# Patient Record
Sex: Female | Born: 1955 | Race: Black or African American | Hispanic: No | Marital: Single | Smoking: Never smoker
Health system: Southern US, Community
[De-identification: ages and names within clinical notes are randomized; demographics above are authoritative.]

## PROBLEM LIST (undated history)

## (undated) HISTORY — PX: CHOLECYSTECTOMY: SHX55

---

## 2000-09-14 ENCOUNTER — Other Ambulatory Visit: Admission: RE | Admit: 2000-09-14 | Discharge: 2000-09-14 | Payer: Self-pay | Admitting: Family Medicine

## 2000-11-20 HISTORY — PX: BREAST BIOPSY: SHX20

## 2000-12-28 ENCOUNTER — Other Ambulatory Visit: Admission: RE | Admit: 2000-12-28 | Discharge: 2000-12-28 | Payer: Self-pay | Admitting: Obstetrics & Gynecology

## 2000-12-28 ENCOUNTER — Encounter: Payer: Self-pay | Admitting: Obstetrics & Gynecology

## 2000-12-28 ENCOUNTER — Encounter (INDEPENDENT_AMBULATORY_CARE_PROVIDER_SITE_OTHER): Payer: Self-pay | Admitting: Specialist

## 2000-12-28 ENCOUNTER — Encounter: Admission: RE | Admit: 2000-12-28 | Discharge: 2000-12-28 | Payer: Self-pay | Admitting: Obstetrics & Gynecology

## 2001-10-09 ENCOUNTER — Encounter: Payer: Self-pay | Admitting: Obstetrics & Gynecology

## 2001-10-09 ENCOUNTER — Encounter: Admission: RE | Admit: 2001-10-09 | Discharge: 2001-10-09 | Payer: Self-pay | Admitting: Obstetrics & Gynecology

## 2002-09-11 ENCOUNTER — Encounter: Admission: RE | Admit: 2002-09-11 | Discharge: 2002-09-11 | Payer: Self-pay | Admitting: Obstetrics & Gynecology

## 2002-09-11 ENCOUNTER — Encounter: Payer: Self-pay | Admitting: Obstetrics & Gynecology

## 2003-03-20 ENCOUNTER — Encounter: Payer: Self-pay | Admitting: Obstetrics & Gynecology

## 2003-03-20 ENCOUNTER — Encounter: Admission: RE | Admit: 2003-03-20 | Discharge: 2003-03-20 | Payer: Self-pay | Admitting: Obstetrics & Gynecology

## 2003-10-08 ENCOUNTER — Encounter: Admission: RE | Admit: 2003-10-08 | Discharge: 2003-10-08 | Payer: Self-pay | Admitting: Obstetrics & Gynecology

## 2004-12-27 ENCOUNTER — Encounter: Admission: RE | Admit: 2004-12-27 | Discharge: 2004-12-27 | Payer: Self-pay | Admitting: Obstetrics & Gynecology

## 2005-07-20 ENCOUNTER — Other Ambulatory Visit: Admission: RE | Admit: 2005-07-20 | Discharge: 2005-07-20 | Payer: Self-pay | Admitting: Obstetrics & Gynecology

## 2006-01-17 ENCOUNTER — Encounter: Admission: RE | Admit: 2006-01-17 | Discharge: 2006-01-17 | Payer: Self-pay | Admitting: Obstetrics & Gynecology

## 2006-02-02 ENCOUNTER — Emergency Department (HOSPITAL_COMMUNITY): Admission: EM | Admit: 2006-02-02 | Discharge: 2006-02-02 | Payer: Self-pay | Admitting: Emergency Medicine

## 2008-02-27 ENCOUNTER — Encounter: Admission: RE | Admit: 2008-02-27 | Discharge: 2008-02-27 | Payer: Self-pay | Admitting: Obstetrics & Gynecology

## 2012-08-15 ENCOUNTER — Other Ambulatory Visit: Payer: Self-pay | Admitting: Obstetrics & Gynecology

## 2012-08-15 DIAGNOSIS — Z1231 Encounter for screening mammogram for malignant neoplasm of breast: Secondary | ICD-10-CM

## 2012-08-16 ENCOUNTER — Ambulatory Visit
Admission: RE | Admit: 2012-08-16 | Discharge: 2012-08-16 | Disposition: A | Discharge status: Home / Self Care | Payer: BC Managed Care – PPO | Source: Ambulatory Visit | Attending: Obstetrics & Gynecology | Admitting: Obstetrics & Gynecology

## 2012-08-16 DIAGNOSIS — Z1231 Encounter for screening mammogram for malignant neoplasm of breast: Secondary | ICD-10-CM

## 2013-10-17 ENCOUNTER — Ambulatory Visit: Payer: BC Managed Care – PPO

## 2013-10-17 ENCOUNTER — Ambulatory Visit (INDEPENDENT_AMBULATORY_CARE_PROVIDER_SITE_OTHER): Payer: BC Managed Care – PPO | Admitting: Family Medicine

## 2013-10-17 VITALS — BP 136/82 | HR 80 | Temp 98.0°F | Resp 17 | Ht 65.5 in | Wt 235.0 lb

## 2013-10-17 DIAGNOSIS — M1710 Unilateral primary osteoarthritis, unspecified knee: Secondary | ICD-10-CM

## 2013-10-17 DIAGNOSIS — M549 Dorsalgia, unspecified: Secondary | ICD-10-CM

## 2013-10-17 DIAGNOSIS — E663 Overweight: Secondary | ICD-10-CM | POA: Insufficient documentation

## 2013-10-17 DIAGNOSIS — Z13220 Encounter for screening for lipoid disorders: Secondary | ICD-10-CM

## 2013-10-17 DIAGNOSIS — Z23 Encounter for immunization: Secondary | ICD-10-CM

## 2013-10-17 DIAGNOSIS — M179 Osteoarthritis of knee, unspecified: Secondary | ICD-10-CM

## 2013-10-17 LAB — COMPREHENSIVE METABOLIC PANEL
ALT: 11 U/L (ref 0–35)
CO2: 27 mEq/L (ref 19–32)
Calcium: 9.8 mg/dL (ref 8.4–10.5)
Chloride: 102 mEq/L (ref 96–112)
Sodium: 137 mEq/L (ref 135–145)
Total Protein: 8.4 g/dL — ABNORMAL HIGH (ref 6.0–8.3)

## 2013-10-17 LAB — POCT CBC
Granulocyte percent: 43.5 %G (ref 37–80)
Hemoglobin: 14.6 g/dL (ref 12.2–16.2)
MPV: 8.4 fL (ref 0–99.8)
POC Granulocyte: 3.2 (ref 2–6.9)
POC MID %: 5.2 %M (ref 0–12)
RBC: 4.87 M/uL (ref 4.04–5.48)

## 2013-10-17 LAB — LIPID PANEL
Cholesterol: 221 mg/dL — ABNORMAL HIGH (ref 0–200)
VLDL: 40 mg/dL (ref 0–40)

## 2013-10-17 NOTE — Patient Instructions (Signed)
Good to see you today.  I will be in touch with your labs.   If you have any concerns please let me know.   You got your flu shot and Tdap today- next time you can just have a plain Td shot
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
## 2013-10-18 ENCOUNTER — Encounter: Payer: Self-pay | Admitting: Family Medicine

## 2014-03-26 ENCOUNTER — Ambulatory Visit (INDEPENDENT_AMBULATORY_CARE_PROVIDER_SITE_OTHER): Payer: BC Managed Care – PPO | Admitting: Family Medicine

## 2014-03-26 VITALS — BP 124/72 | HR 79 | Temp 97.9°F | Resp 16 | Ht 65.0 in | Wt 223.6 lb

## 2014-03-26 DIAGNOSIS — M25476 Effusion, unspecified foot: Secondary | ICD-10-CM

## 2014-03-26 DIAGNOSIS — M25472 Effusion, left ankle: Secondary | ICD-10-CM

## 2014-03-26 DIAGNOSIS — L0390 Cellulitis, unspecified: Secondary | ICD-10-CM

## 2014-03-26 DIAGNOSIS — L0291 Cutaneous abscess, unspecified: Secondary | ICD-10-CM

## 2014-03-26 DIAGNOSIS — M25473 Effusion, unspecified ankle: Secondary | ICD-10-CM

## 2014-03-26 MED ORDER — CEFTRIAXONE SODIUM 1 G IJ SOLR
1.0000 g | Freq: Once | INTRAMUSCULAR | Status: AC
  Administered 2014-03-26: 1 g via INTRAMUSCULAR

## 2014-03-26 MED ORDER — DOXYCYCLINE HYCLATE 100 MG PO TABS: 100.0000 mg | ORAL_TABLET | Freq: Two times a day (BID) | ORAL | Status: DC

## 2014-03-26 NOTE — Patient Instructions (Signed)
Cellulitis Cellulitis is an infection of the skin and the tissue beneath it. The infected area is usually red and tender. Cellulitis occurs most often in the arms and lower legs.  CAUSES  Cellulitis is caused by bacteria that enter the skin through cracks or cuts in the skin. The most common types of bacteria that cause cellulitis are Staphylococcus and Streptococcus. SYMPTOMS   Redness and warmth.  Swelling.  Tenderness or pain.  Fever. DIAGNOSIS  Your caregiver can usually determine what is wrong based on a physical exam. Blood tests may also be done. TREATMENT  Treatment usually involves taking an antibiotic medicine. HOME CARE INSTRUCTIONS   Take your antibiotics as directed. Finish them even if you start to feel better.  Keep the infected arm or leg elevated to reduce swelling.  Apply a warm cloth to the affected area up to 4 times per day to relieve pain.  Only take over-the-counter or prescription medicines for pain, discomfort, or fever as directed by your caregiver.  Keep all follow-up appointments as directed by your caregiver. SEEK MEDICAL CARE IF:   You notice red streaks coming from the infected area.  Your red area gets larger or turns dark in color.  Your bone or joint underneath the infected area becomes painful after the skin has healed.  Your infection returns in the same area or another area.  You notice a swollen bump in the infected area.  You develop new symptoms. SEEK IMMEDIATE MEDICAL CARE IF:   You have a fever.  You feel very sleepy.  You develop vomiting or diarrhea.  You have a general ill feeling (malaise) with muscle aches and pains. MAKE SURE YOU:   Understand these instructions.  Will watch your condition.  Will get help right away if you are not doing well or get worse. Document Released: 08/16/2005 Document Revised: 05/07/2012 Document Reviewed: 01/22/2012 [employer] Patient Information 2014 [employer].  

## 2014-03-26 NOTE — Progress Notes (Signed)
Chief Complaint:  Chief Complaint  Patient presents with  . Insect Bite    lt foot  and ankle red and swolling x 2 days    HPI: Kathryn White is a 58 y.o. female who is here for  2 days ago and noticed she had foot swelling, she was driving to [place] and noticed it. She had itching and has tried hydrocortisone on it. She has had rednes and warmth asociated with this. Denies fevers or chills. The redness is about the same for the last 2 days.   History reviewed. No pertinent past medical history. Past Surgical History  Procedure Laterality Date  . Cholecystectomy     History   Social History  . Marital Status: Single    Spouse Name: N/A    Number of Children: N/A  . Years of Education: N/A   Social History Main Topics  . Smoking status: Never Smoker   . Smokeless tobacco: None  . Alcohol Use: No  . Drug Use: No  . Sexual Activity: No   Other Topics Concern  . None   Social History Narrative  . None   Family History  Problem Relation Age of Onset  . Cancer Mother    Allergies  Allergen Reactions  . Sulfa Antibiotics Other (See Comments)    Patient does not remember    Prior to Admission medications   Not on File     ROS: The patient denies fevers, chills, night sweats, unintentional weight loss, chest pain, palpitations, wheezing, dyspnea on exertion, nausea, vomiting, abdominal pain, dysuria, hematuria, melena, numbness, weakness, or tingling.  All other systems have been reviewed and were otherwise negative with the exception of those mentioned in the HPI and as above.    PHYSICAL EXAM: Filed Vitals:   03/26/14 1226  BP: 124/72  Pulse: 79  Temp: 97.9 F (36.6 C)  Resp: 16   Filed Vitals:   03/26/14 1226  Height: 5\' 5"  (1.651 m)  Weight: 223 lb 9.6 oz (101.424 kg)   Body mass index is 37.21 kg/(m^2).  General: Alert, no acute distress HEENT:  Normocephalic, atraumatic, oropharynx patent. EOMI, PERRLA Cardiovascular:  Regular  rate and rhythm, no rubs murmurs or gallops.  No Carotid bruits, radial pulse intact. No pedal edema.  Respiratory: Clear to auscultation bilaterally.  No wheezes, rales, or rhonchi.  No cyanosis, no use of accessory musculature GI: No organomegaly, abdomen is soft and non-tender, positive bowel sounds.  No masses. Skin: + cellulitic rash on left foot to above ankle Neurologic: Facial musculature symmetric. Psychiatric: Patient is appropriate throughout our interaction. Lymphatic: No cervical lymphadenopathy Musculoskeletal: Gait intact.   LABS: Results for orders placed in visit on 10/17/13  COMPREHENSIVE METABOLIC PANEL      Result Value Ref Range   Sodium 137  135 - 145 mEq/L   Potassium 4.5  3.5 - 5.3 mEq/L   Chloride 102  96 - 112 mEq/L   CO2 27  19 - 32 mEq/L   Glucose, Bld 98  70 - 99 mg/dL   BUN 9  6 - 23 mg/dL   Creat 0.980.86  1.190.50 - 1.471.10 mg/dL   Total Bilirubin 0.5  0.3 - 1.2 mg/dL   Alkaline Phosphatase 69  39 - 117 U/L   AST 13  0 - 37 U/L   ALT 11  0 - 35 U/L   Total Protein 8.4 (*) 6.0 - 8.3 g/dL   Albumin 4.6  3.5 - 5.2  g/dL   Calcium 9.8  8.4 - 40.910.5 mg/dL  LIPID PANEL      Result Value Ref Range   Cholesterol 221 (*) 0 - 200 mg/dL   Triglycerides 811200 (*) <150 mg/dL   HDL 45  >91>39 mg/dL   Total CHOL/HDL Ratio 4.9     VLDL 40  0 - 40 mg/dL   LDL Cholesterol 478136 (*) 0 - 99 mg/dL  POCT CBC      Result Value Ref Range   WBC 7.3  4.6 - 10.2 K/uL   Lymph, poc 3.7 (*) 0.6 - 3.4   POC LYMPH PERCENT 51.3 (*) 10 - 50 %L   MID (cbc) 0.4  0 - 0.9   POC MID % 5.2  0 - 12 %M   POC Granulocyte 3.2  2 - 6.9   Granulocyte percent 43.5  37 - 80 %G   RBC 4.87  4.04 - 5.48 M/uL   Hemoglobin 14.6  12.2 - 16.2 g/dL   HCT, POC 29.546.7  62.137.7 - 47.9 %   MCV 95.8  80 - 97 fL   MCH, POC 30.0  27 - 31.2 pg   MCHC 31.3 (*) 31.8 - 35.4 g/dL   RDW, POC 30.814.5     Platelet Count, POC 359  142 - 424 K/uL   MPV 8.4  0 - 99.8 fL     EKG/XRAY:   Primary [facility]   ASSESSMENT/PLAN: Encounter Diagnoses  Name Primary?  . Cellulitis Yes  . Left ankle swelling    Roocephin 1 gram IM given in office Rx Doxycyline 100 mg BID Precautiosn given, marked skin with skin marker Work note given F/u prn  Gross sideeffects, risk and benefits, and alternatives of medications d/w patient. Patient is aware that all medications have potential sideeffects and we are unable to predict every sideeffect or drug-drug interaction that may occur.  Lenell Antuhao P Le, DO 03/26/2014 1:29 PM

## 2014-11-23 ENCOUNTER — Ambulatory Visit (INDEPENDENT_AMBULATORY_CARE_PROVIDER_SITE_OTHER): Payer: BC Managed Care – PPO | Admitting: Family Medicine

## 2014-11-23 VITALS — BP 134/84 | HR 77 | Temp 97.8°F | Resp 16 | Ht 65.0 in | Wt 237.2 lb

## 2014-11-23 DIAGNOSIS — M549 Dorsalgia, unspecified: Secondary | ICD-10-CM

## 2014-11-23 DIAGNOSIS — M25519 Pain in unspecified shoulder: Secondary | ICD-10-CM

## 2014-11-23 DIAGNOSIS — M546 Pain in thoracic spine: Secondary | ICD-10-CM

## 2014-11-23 MED ORDER — CYCLOBENZAPRINE HCL 10 MG PO TABS: 10.0000 mg | ORAL_TABLET | Freq: Every day | ORAL | Status: DC

## 2014-11-23 MED ORDER — DICLOFENAC SODIUM 75 MG PO TBEC: 75.0000 mg | DELAYED_RELEASE_TABLET | Freq: Two times a day (BID) | ORAL | Status: DC

## 2014-11-23 NOTE — Progress Notes (Signed)
Subjective: Patient has been having problems in recent times with her upper back and shoulders hurting her. Especially bad last few weeks. 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 She did get his exercise bike and plans to be getting a regular program with that. No other major complaints. No injuries or falls.  Objective: Pleasant lady in no major distress. Neck has full range of motion. No thyromegaly or carotid bruits. Chest clear. Heart regular without murmurs. She has mild tenderness of the upper back muscles of the right scapula below the left scapula. Good range of motion of the shoulders and spine. Abdomen has normal bowel sounds, soft without masses or tenderness. Straight leg raise test negative.  Assessment: Upper back pain, not musculoskeletal mechanical pain  Plan: Urged back stretching exercises which I instructed her to do in a door frame a quarter. Flexeril 10 at bedtime diclofenac twice daily Diclofenac 75 twice daily Return if worse or not improving

## 2014-11-23 NOTE — Patient Instructions (Signed)
Do the shoulder stretching as discussed.  Take the muscle relaxant cyclobenzaprine one at bedtime. Most people tolerate this well. However if it makes her feel at all draggy in the daytime just  take a half of one at bedtime  Take the diclofenac one twice daily at breakfast and supper for pain and inflammation  Try to get regular exercise  Return if worse or not improving

## 2014-12-05 ENCOUNTER — Other Ambulatory Visit: Payer: Self-pay | Admitting: Family Medicine

## 2014-12-07 NOTE — Telephone Encounter (Signed)
Dr Alwyn RenHopper, do you want to RF?

## 2014-12-07 NOTE — Telephone Encounter (Signed)
Okay to refill once.  Should return for recheck this week or at least before finishing the diclofenac if still having significant symptoms.

## 2014-12-08 NOTE — Telephone Encounter (Signed)
I cannot tell why this came back to me.  Was it taken care of and prescribed?

## 2016-08-14 ENCOUNTER — Ambulatory Visit (INDEPENDENT_AMBULATORY_CARE_PROVIDER_SITE_OTHER): Payer: BC Managed Care – PPO | Admitting: Physician Assistant

## 2016-08-14 VITALS — BP 132/90 | HR 72 | Temp 98.4°F | Resp 17 | Ht 65.0 in | Wt 233.0 lb

## 2016-08-14 DIAGNOSIS — G44219 Episodic tension-type headache, not intractable: Secondary | ICD-10-CM

## 2016-08-14 DIAGNOSIS — R591 Generalized enlarged lymph nodes: Secondary | ICD-10-CM | POA: Diagnosis not present

## 2016-08-14 NOTE — Progress Notes (Signed)
08/14/2016 5:47 PM   DOB: 28-Dec-1955 / MRN: [id number]  SUBJECTIVE:  Kathryn White is a 60 y.o. female presenting for HA right sided posterior ocular HA that started 7 days ago.  The pin is constant, no photophobia or phonophobia, or nausea.  No vision changes.     Reports that she has had this in the same place 3 days in a row.  She has tried Aleve and this gave her some relief.  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   She is allergic to sulfa antibiotics.   She  has no past medical history on file.    She  reports that she has never smoked. She does not have any smokeless tobacco history on file. She reports that she does not drink alcohol or use drugs. She  reports that she does not engage in sexual activity. The patient  has a past surgical history that includes Cholecystectomy.  Her family history includes Cancer in her mother.  Review of Systems  Constitutional: Negative for chills and fever.  HENT: Positive for congestion. Negative for hearing loss, nosebleeds and sore throat.   Respiratory: Negative for cough and stridor.   Cardiovascular: Negative for chest pain.  Skin: Negative for itching and rash.  Neurological: Positive for headaches. Negative for dizziness.    The problem list and medications were reviewed and updated by myself where necessary and exist elsewhere in the encounter.   OBJECTIVE:  BP 132/90 (BP Location: Left Arm, Patient Position: Sitting, Cuff Size: Normal)   Pulse 72   Temp 98.4 F (36.9 C) (Oral)   Resp 17   Ht 5\' 5"  (1.651 m)   Wt 233 lb (105.7 kg)   SpO2 96%   BMI 38.77 kg/m   Physical Exam  Constitutional: She is oriented to person, place, and time.  HENT:  Right Ear: Tympanic membrane normal.  Left Ear: Tympanic membrane normal.  Nose: Nose normal.  Mouth/Throat: Uvula is midline, oropharynx is clear and moist and mucous membranes are  normal.  Cardiovascular: Normal rate and regular rhythm.   Pulmonary/Chest: Effort normal and breath sounds normal.  Abdominal: Soft. Bowel sounds are normal.  Musculoskeletal: Normal range of motion.  Lymphadenopathy:       Head (right side): Submandibular adenopathy present. No submental, no tonsillar, no preauricular, no posterior auricular and no occipital adenopathy present.       Head (left side): No submental, no submandibular, no tonsillar, no preauricular, no posterior auricular and no occipital adenopathy present.    She has no cervical adenopathy.  Neurological: She is alert and oriented to person, place, and time. She has normal reflexes. She displays no atrophy, no tremor and normal reflexes. No cranial nerve deficit or sensory deficit. She exhibits normal muscle tone. She displays no seizure activity. Coordination and gait normal. GCS eye subscore is 4. GCS verbal subscore is 5. GCS motor subscore is 6.  Skin: Skin is warm and dry.  Psychiatric: She has a normal mood and affect.    No results found for this or any previous visit (from the past 72 hour(s)).  No results found.  Lab Results  Component Value Date   CREATININE 0.86 10/17/2013     ASSESSMENT AND PLAN  Kathryn White was seen today for headache.  Diagnoses and all orders for this visit:  Episodic tension-type headache, not intractable: She does not have any  alarm features. She has a submandibular lymph node which may represent a sinus infection.  If her sed rate comes back elevated I will treat her with a Z-pack.  -     Sedimentation Rate  Lymphadenopathy: See PE.  No other signs of a sinus infection.     The patient is advised to call or return to clinic if she does not see an improvement in symptoms, or to seek the care of the closest emergency department if she worsens with the above plan.   Kathryn White, MHS, PA-C [facility] 08/14/2016 5:47 PM

## 2016-08-14 NOTE — Patient Instructions (Addendum)
For now take 1000 mg of Tylenol every 8 hours as needed for headache.  It is okay to add Aleve 225 mg once in the morning and once at night to this regimen.     IF you received an x-ray today, you will [facility] from [facility]. Please contact [facility] at [phone] with questions or concerns regarding your invoice.   IF you received labwork today, you will receive an invoice from [facility]. Please contact [facility] at [phone] with questions or concerns regarding your invoice.   Our billing staff will not be able to assist you with questions regarding bills from these companies.  You will be contacted with the lab results as soon as they are available. The fastest way to get your results is to activate your My Chart account. Instructions are located on the last page of this paperwork. If you have not heard from us regarding the results in 2 weeks, please contact this office.

## 2016-08-16 LAB — SEDIMENTATION RATE: Sed Rate: 22 mm/hr (ref 0–30)

## 2022-01-09 ENCOUNTER — Other Ambulatory Visit: Payer: Self-pay | Admitting: Family Medicine

## 2022-01-09 DIAGNOSIS — Z1231 Encounter for screening mammogram for malignant neoplasm of breast: Secondary | ICD-10-CM

## 2022-01-17 ENCOUNTER — Ambulatory Visit
Admission: RE | Admit: 2022-01-17 | Discharge: 2022-01-17 | Disposition: A | Discharge status: Home / Self Care | Payer: BC Managed Care – PPO | Source: Ambulatory Visit | Attending: Family Medicine | Admitting: Family Medicine

## 2022-01-17 DIAGNOSIS — Z1231 Encounter for screening mammogram for malignant neoplasm of breast: Secondary | ICD-10-CM

## 2022-02-07 ENCOUNTER — Other Ambulatory Visit: Payer: Self-pay | Admitting: Family Medicine

## 2022-02-07 DIAGNOSIS — E2839 Other primary ovarian failure: Secondary | ICD-10-CM

## 2022-02-21 ENCOUNTER — Ambulatory Visit
Admission: RE | Admit: 2022-02-21 | Discharge: 2022-02-21 | Disposition: A | Discharge status: Home / Self Care | Payer: BC Managed Care – PPO | Source: Ambulatory Visit | Attending: Family Medicine | Admitting: Family Medicine

## 2022-02-21 DIAGNOSIS — E2839 Other primary ovarian failure: Secondary | ICD-10-CM

## 2023-01-02 ENCOUNTER — Other Ambulatory Visit: Payer: Self-pay | Admitting: Family Medicine

## 2023-01-02 DIAGNOSIS — Z1231 Encounter for screening mammogram for malignant neoplasm of breast: Secondary | ICD-10-CM

## 2023-02-14 ENCOUNTER — Ambulatory Visit
Admission: RE | Admit: 2023-02-14 | Discharge: 2023-02-14 | Disposition: A | Discharge status: Home / Self Care | Payer: BC Managed Care – PPO | Source: Ambulatory Visit | Attending: Family Medicine | Admitting: Family Medicine

## 2023-02-14 DIAGNOSIS — Z1231 Encounter for screening mammogram for malignant neoplasm of breast: Secondary | ICD-10-CM

## 2024-01-27 IMAGING — MG MM DIGITAL SCREENING BILAT W/ TOMO AND CAD
6 of 10 series · 6 of 30 positions shown · non-contrast
Comparison: Previous exam(s).

CLINICAL DATA: Screening.

EXAM:
DIGITAL SCREENING BILATERAL MAMMOGRAM WITH TOMOSYNTHESIS AND CAD
TECHNIQUE: Bilateral screening digital craniocaudal and mediolateral oblique
mammograms were obtained. Bilateral screening digital breast
tomosynthesis was performed. The images were evaluated with
computer-aided detection.

[R MLO synth-2D]
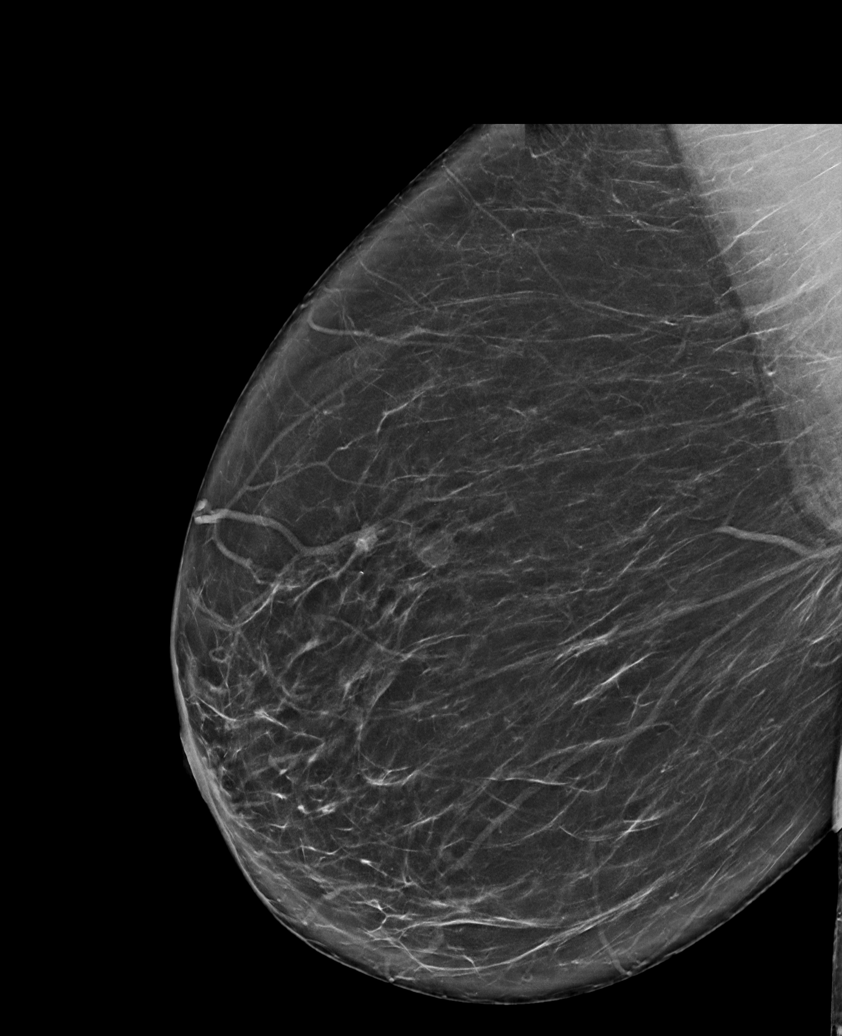

[L MLO synth-2D (1 of 2)]
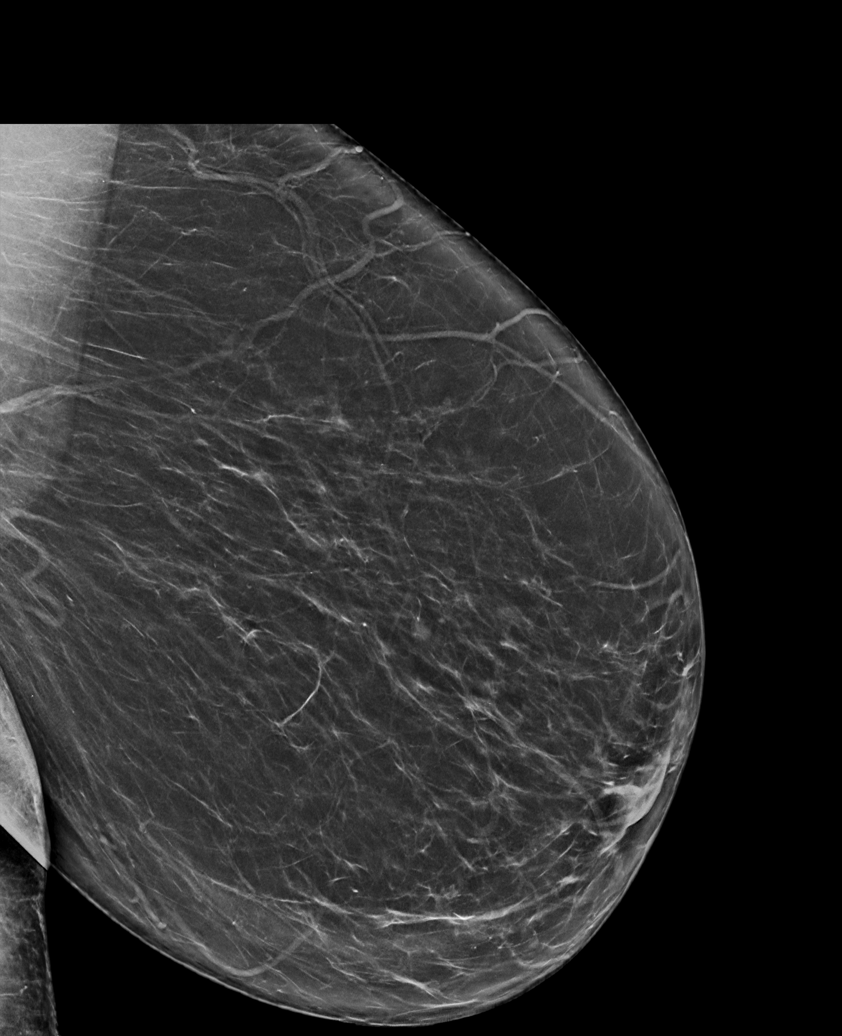

[R CC synth-2D]
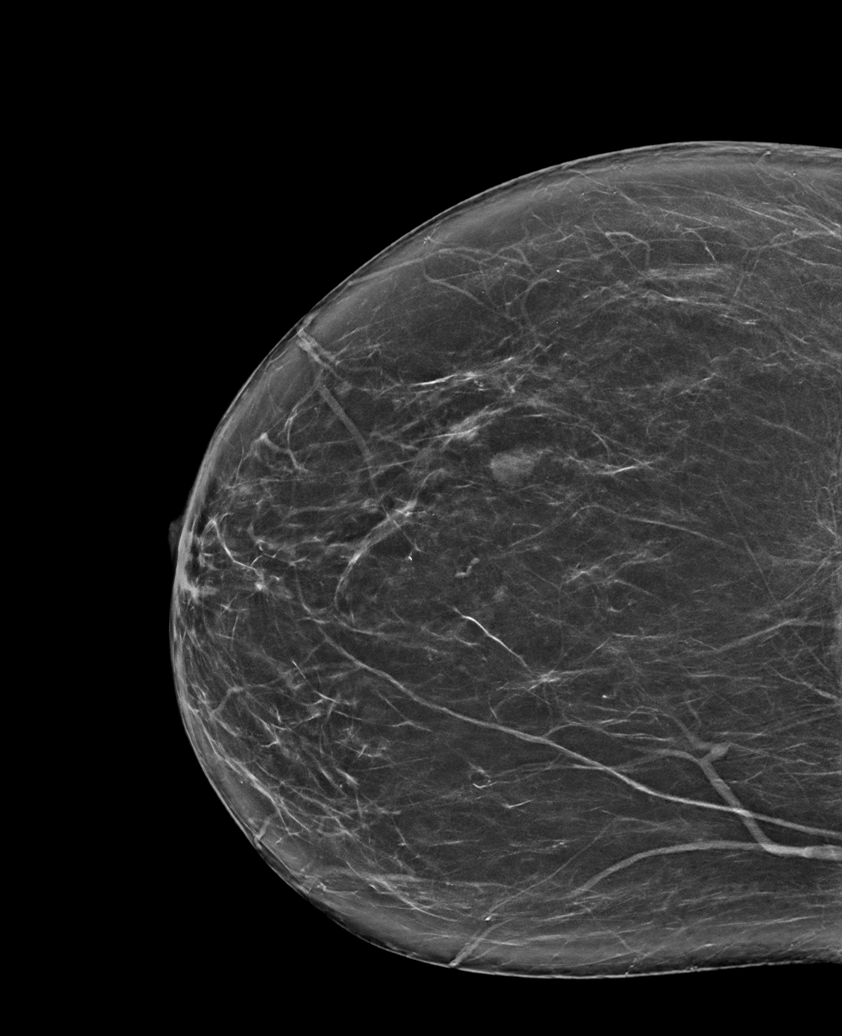

[L MLO synth-2D (2 of 2)]
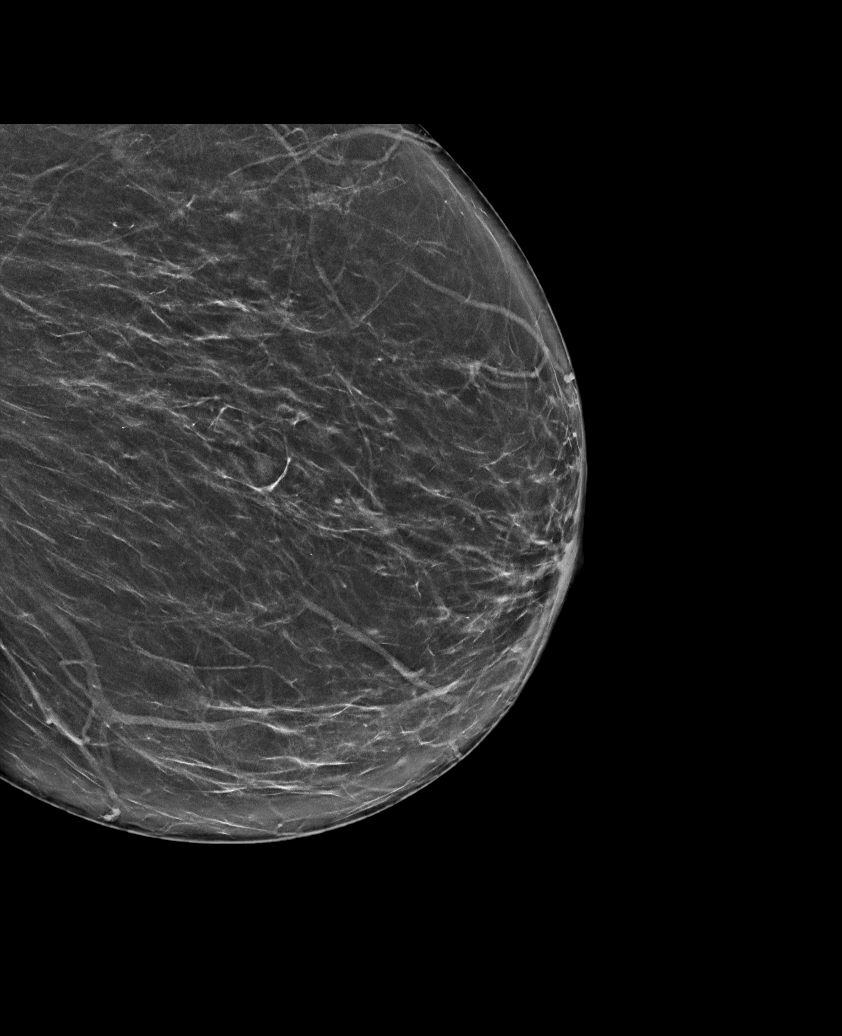

[L CC synth-2D]
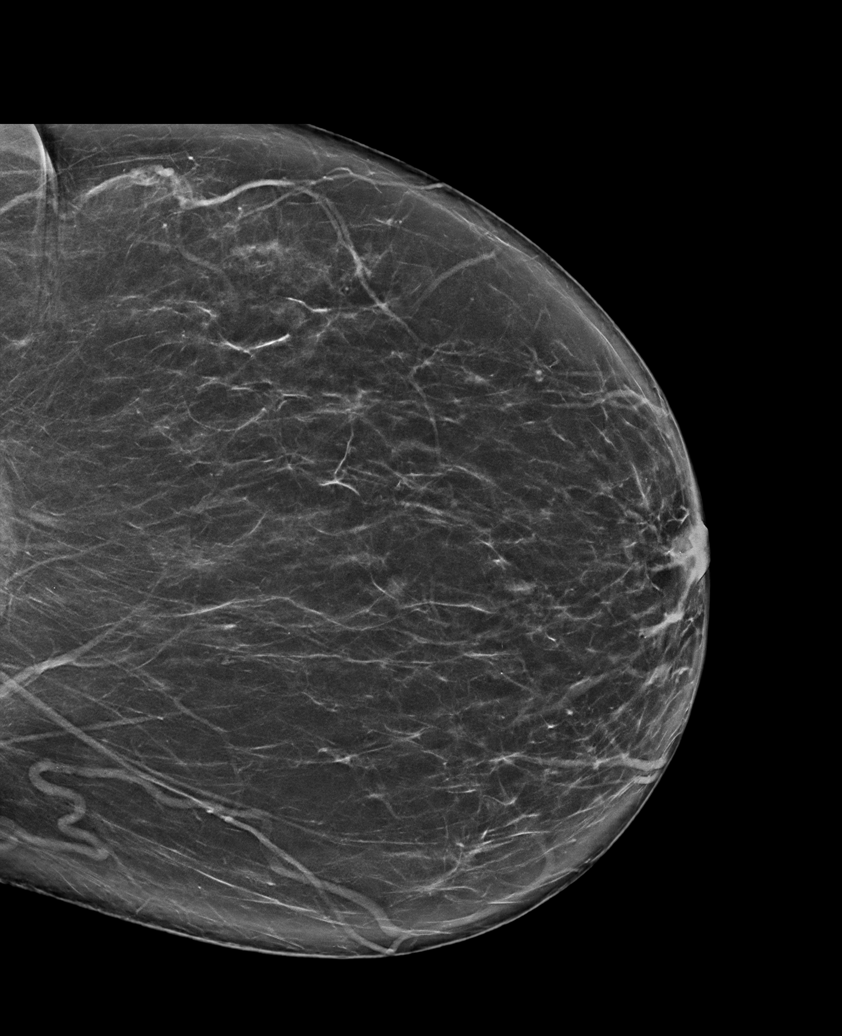

[L CC tomo · tomo slice 41/80.0]
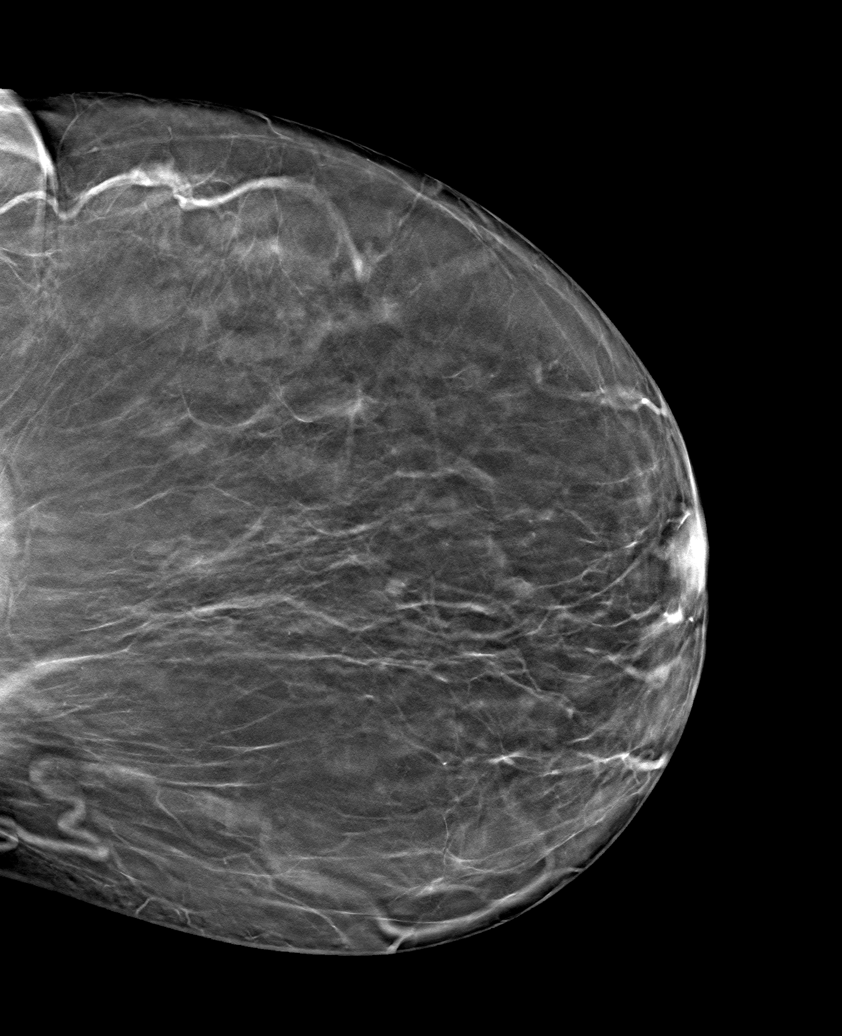

[6 of 30 positions shown; findings below may reference images not displayed]

ACR Breast Density Category b: There are scattered areas of
fibroglandular density.
FINDINGS: There are no findings suspicious for malignancy.
IMPRESSION: No mammographic evidence of malignancy. A result letter of this
screening mammogram will be mailed directly to the patient.

RECOMMENDATION:
Screening mammogram in one year. (Code:51-O-LD2)

BI-RADS CATEGORY  1: Negative.

## 2024-03-14 ENCOUNTER — Other Ambulatory Visit: Payer: Self-pay | Admitting: Family Medicine

## 2024-03-14 DIAGNOSIS — Z1231 Encounter for screening mammogram for malignant neoplasm of breast: Secondary | ICD-10-CM

## 2024-03-24 ENCOUNTER — Ambulatory Visit
Admission: RE | Admit: 2024-03-24 | Discharge: 2024-03-24 | Disposition: A | Discharge status: Home / Self Care | Payer: Self-pay | Source: Ambulatory Visit | Attending: Family Medicine | Admitting: Family Medicine

## 2024-03-24 DIAGNOSIS — Z1231 Encounter for screening mammogram for malignant neoplasm of breast: Secondary | ICD-10-CM
